# Patient Record
Sex: Male | Born: 1990 | Race: White | Hispanic: No | Marital: Single | Smoking: Never smoker
Health system: Southern US, Community
[De-identification: ages and names within clinical notes are randomized; demographics above are authoritative.]

## PROBLEM LIST (undated history)

## (undated) DIAGNOSIS — B019 Varicella without complication: Secondary | ICD-10-CM

## (undated) DIAGNOSIS — B029 Zoster without complications: Secondary | ICD-10-CM

## (undated) HISTORY — DX: Varicella without complication: B01.9

## (undated) HISTORY — DX: Zoster without complications: B02.9

---
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
## 2014-03-26 ENCOUNTER — Ambulatory Visit (INDEPENDENT_AMBULATORY_CARE_PROVIDER_SITE_OTHER): Payer: 59 | Admitting: Psychology

## 2014-03-26 DIAGNOSIS — F331 Major depressive disorder, recurrent, moderate: Secondary | ICD-10-CM

## 2014-04-16 ENCOUNTER — Ambulatory Visit (INDEPENDENT_AMBULATORY_CARE_PROVIDER_SITE_OTHER): Payer: 59 | Admitting: Psychology

## 2014-04-16 DIAGNOSIS — F331 Major depressive disorder, recurrent, moderate: Secondary | ICD-10-CM

## 2014-05-09 ENCOUNTER — Ambulatory Visit (INDEPENDENT_AMBULATORY_CARE_PROVIDER_SITE_OTHER): Payer: 59 | Admitting: Psychology

## 2014-05-09 DIAGNOSIS — F331 Major depressive disorder, recurrent, moderate: Secondary | ICD-10-CM

## 2014-05-14 ENCOUNTER — Encounter (HOSPITAL_COMMUNITY): Payer: Self-pay | Admitting: Psychology

## 2014-05-14 NOTE — Progress Notes (Signed)
PROGRESS NOTE  Patient:  Bryan Davidson  II   DOB: 18-Nov-1990  MR Number: [id number]  [facility] [address] [place] [address] [place] [address] Dept: [phone]  Start: 9 AM End: 10 AM  Provider/Observer:     Hershal CoriaJohn R Rodenbough PSYD  Chief Complaint:      Chief Complaint  Patient presents with  . Depression    Reason For Service:     The patient was self-referred at the suggestion by his mother in detail. The patient reports that he has been dealing with depression, anger issues and difficulty sleeping. The patient reports a major stressor now have to do with financial issues and his job situation. The patient reports his mom felt that he needed to see someone. The patient reports that "I know I was depressed and not sleeping" patient for is having difficulty with his father and brother. The patient reports he experienced depression when he was [age] or 23 years old when he is moved out of the house and was under major financial issues and 1. He reports he tried to go back to school but has been out of school again since May. The patient's wife reports that he will often reactions that do not fit the situation. She reports removal of the smallest things. The patient reports that if he is not taking melatonin that he will have very difficult sleep 6 or 7 nights out of the week. He reports that he will wake up around 3 to 5 AM and not to go back to sleep. He reports that he does go back to sleep he tends to have great difficulty waking up this time. The patient reports he does do better when he takes melatonin.   Interventions Strategy:  Cognitive/behavioral psychotherapeutic interventions  Participation Level:   Active  Participation Quality:  Appropriate      Behavioral Observation:  Well Groomed, Alert, and Appropriate.   Current Psychosocial Factors: The patient reports he has been doing much  better in that he is begun working on his sleep hygiene issues and dietary changes and we talked about. He reports that he is working on staying with her and his family versus isolating himself.  Content of Session:   Recurrent symptoms and continued work on therapeutic interventions were issues of recurrent depression.  Current Status:   The patient reports that he does continue to experience depression and sleep disturbance but he has been actively working on some of the initial therapeutic interventions we have developed. He reports his depression and sleep have been improving.  Patient Progress:   Good  Target Goals:   Target goals include reducing the intensity, severity, duration of symptoms of depression including feelings of helplessness and hopelessness, social isolation, and negative self worth.  Last Reviewed:   10/70 2015  Goals Addressed Today:    Today we worked on building better coping skills were issues of recurrent depression including cognitive interventions around what he interprets as situation as well as behavioral interventions.  Impression/Diagnosis:  The patient has a history of depression on at least a couple of occasions. He clearly has struggled with moving forward in his life and has had difficulty following through with academic efforts. The patient has a history of poor sleep which likely exacerbates his mood and made him irritable and agitated.   Diagnosis:    Axis I: Major depressive disorder, recurrent episode, moderate  Hershal CoriaODENBOUGH,JOHN R, PsyD 05/14/2014

## 2014-05-14 NOTE — Progress Notes (Signed)
PROGRESS NOTE  Patient:   Bryan Davidson   DOB:   04-13-1991  MR Number:  [id number]  Location:  [facility] [address] [place] [address] Dept: [phone]           Date of Service:   03/26/2014  Start Time:   9 AM End Time:   10 AM  Provider/Observer:  Hershal CoriaJohn R Rodenbough PSYD       Billing Code/Service: [phone]  Chief Complaint:     Chief Complaint  Patient presents with  . Depression  . Anxiety    Reason for Service:  The patient was self-referred at the suggestion by his mother in detail. The patient reports that he has been dealing with depression, anger issues and difficulty sleeping. The patient reports a major stressor now have to do with financial issues and his job situation. The patient reports his mom felt that he needed to see someone. The patient reports that "I know I was depressed and not sleeping" patient for is having difficulty with his father and brother. The patient reports he experienced depression when he was [age] or 23 years old when he is moved out of the house and was under major financial issues and 1. He reports he tried to go back to school but has been out of school again since May. The patient's wife reports that he will often reactions that do not fit the situation. She reports removal of the smallest things. The patient reports that if he is not taking melatonin that he will have very difficult sleep 6 or 7 nights out of the week. He reports that he will wake up around 3 to 5 AM and not to go back to sleep. He reports that he does go back to sleep he tends to have great difficulty waking up this time. The patient reports he does do better when he takes melatonin.  Current Status:  The patient describes moderate to significant symptoms of depression, mood changes, sleep disturbance, insomnia, irritability and agitation. The patient reports this has been  going on significantly over the past 12 months. He describes depression, one in the lower anger management skills and better sleep patterns.  Reliability of Information: Information provided by the patient.  Behavioral Observation: Bryan Davidson  presents as a 23 y.o.-year-old Right Caucasian Male who appeared his stated age. his dress was Appropriate and he was Well Groomed and his manners were Appropriate to the situation.  There were not any physical disabilities noted.  he displayed an appropriate level of cooperation and motivation.    Interactions:    Active   Attention:   within normal limits  Memory:   within normal limits  Visuo-spatial:   within normal limits  Speech (Volume):  normal  Speech:   normal pitch  Thought Process:  Coherent  Though Content:  WNL  Orientation:   person, place, time/date and situation  Judgment:   Good  Planning:   Good  Affect:    Depressed  Mood:    Depressed  Insight:   Good  Intelligence:   normal  Marital Status/Living: [place]. The patient reports that he is single and lives with his parents, his brother, sister, grandfather, and grandmother.  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  Past Employment:    Substance Use:  No concerns of substance abuse are reported.    Education:   HS Graduate  Medical History:   Past Medical History  Diagnosis Date  . Chickenpox   . Shingles 2004    2004        No outpatient encounter prescriptions on file as of 03/26/2014.        The patient reports he did have a chance to go to college but academic suspensions from several schools including [employer]. [[employer] recently as well as an [employer].  Sexual History:   History  Sexual Activity  . Sexual Activity: Yes    Abuse/Trauma History: The patient denies any history of  abuse or trauma.  Psychiatric History:  The patient acknowledges a previous episode of depression and acknowledges that there may have been issues of depression previously. This is the first time he has sought professional interventions.  Family Med/Psych History: History reviewed. No pertinent family history.  Risk of Suicide/Violence: virtually non-existent the patient denies any suicidal or homicidal ideation.  Impression/DX:  The patient has a history of depression on at least a couple of occasions. He clearly has struggled with moving forward in his life and has had difficulty following through with academic efforts. The patient has a history of poor sleep which likely exacerbates his mood and made him irritable and agitated.  Disposition/Plan:  We will set the patient for individual psychotherapeutic interventions. He is not currently interested in psychotropic medications at this time.  Diagnosis:    Axis I:  Major depressive disorder, recurrent episode, moderate           RODENBOUGH,JOHN R, PsyD 05/14/2014

## 2014-05-14 NOTE — Progress Notes (Signed)
PROGRESS NOTE  Patient:  Bryan Davidson  II   DOB: [birth year]  MR Number: [id number]  [facility] [address] [place] [address] Dept: [phone]  Start: 11 AM End: 12 PM  Provider/Observer:     Hershal CoriaJohn R Kierria Feigenbaum PSYD  Chief Complaint:      Chief Complaint  Patient presents with  . Depression    Reason For Service:     The patient was self-referred at the suggestion by his mother in detail. The patient reports that he has been dealing with depression, anger issues and difficulty sleeping. The patient reports a major stressor now have to do with financial issues and his job situation. The patient reports his mom felt that he needed to see someone. The patient reports that "I know I was depressed and not sleeping" patient for is having difficulty with his father and brother. The patient reports he experienced depression when he was [age] or 23 years old when he is moved out of the house and was under major financial issues and 1. He reports he tried to go back to school but has been out of school again since May. The patient's wife reports that he will often reactions that do not fit the situation. She reports removal of the smallest things. The patient reports that if he is not taking melatonin that he will have very difficult sleep 6 or 7 nights out of the week. He reports that he will wake up around 3 to 5 AM and not to go back to sleep. He reports that he does go back to sleep he tends to have great difficulty waking up this time. The patient reports he does do better when he takes melatonin.   Interventions Strategy:  Cognitive/behavioral psychotherapeutic interventions  Participation Level:   Active  Participation Quality:  Appropriate      Behavioral Observation:  Well Groomed, Alert, and Appropriate.   Current Psychosocial Factors: The patient reports he has been doing  much better in that he is begun working on his sleep hygiene issues and dietary changes and we talked about. He reports that he is working on staying with her and his family versus isolating himself.  Content of Session:   Recurrent symptoms and continued work on therapeutic interventions were issues of recurrent depression.  Current Status:   The patient reports that he does continue to experience depression and sleep disturbance but he has been actively working on some of the initial therapeutic interventions we have developed. He reports his depression and sleep have been improving.  Patient Progress:   Good  Target Goals:   Target goals include reducing the intensity, severity, duration of symptoms of depression including feelings of helplessness and hopelessness, social isolation, and negative self worth.  Last Reviewed:   04/16/2014  Goals Addressed Today:    Today we worked on building better coping skills were issues of recurrent depression including cognitive interventions around what he interprets as situation as well as behavioral interventions.  Impression/Diagnosis:  The patient has a history of depression on at least a couple of occasions. He clearly has struggled with moving forward in his life and has had difficulty following through with academic efforts. The patient has a history of poor sleep which likely exacerbates his mood and made him irritable and agitated.   Diagnosis:    Axis I: Major depressive disorder, recurrent episode, moderate  Hershal CoriaODENBOUGH,Lachandra Dettmann R, PsyD 05/14/2014

## 2014-05-30 ENCOUNTER — Ambulatory Visit (HOSPITAL_COMMUNITY): Payer: Self-pay | Admitting: Psychology

## 2018-01-18 ENCOUNTER — Other Ambulatory Visit: Payer: Self-pay | Admitting: *Deleted

## 2019-03-20 ENCOUNTER — Other Ambulatory Visit: Payer: Self-pay

## 2019-03-20 DIAGNOSIS — Z20822 Contact with and (suspected) exposure to covid-19: Secondary | ICD-10-CM

## 2019-03-21 LAB — NOVEL CORONAVIRUS, NAA: SARS-CoV-2, NAA: NOT DETECTED
